# Patient Record
Sex: Male | Born: 1995
Health system: Southern US, Community
[De-identification: ages and names within clinical notes are randomized; demographics above are authoritative.]

---
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
## 2017-08-11 ENCOUNTER — Emergency Department (HOSPITAL_BASED_OUTPATIENT_CLINIC_OR_DEPARTMENT_OTHER): Payer: Self-pay

## 2017-08-11 ENCOUNTER — Other Ambulatory Visit: Payer: Self-pay

## 2017-08-11 ENCOUNTER — Encounter (HOSPITAL_BASED_OUTPATIENT_CLINIC_OR_DEPARTMENT_OTHER): Payer: Self-pay | Admitting: Emergency Medicine

## 2017-08-11 ENCOUNTER — Emergency Department (HOSPITAL_BASED_OUTPATIENT_CLINIC_OR_DEPARTMENT_OTHER)
Admission: EM | Admit: 2017-08-11 | Discharge: 2017-08-11 | Disposition: A | Discharge status: Home / Self Care | Payer: Self-pay | Attending: Emergency Medicine | Admitting: Emergency Medicine

## 2017-08-11 DIAGNOSIS — F17290 Nicotine dependence, other tobacco product, uncomplicated: Secondary | ICD-10-CM | POA: Insufficient documentation

## 2017-08-11 DIAGNOSIS — R638 Other symptoms and signs concerning food and fluid intake: Secondary | ICD-10-CM | POA: Insufficient documentation

## 2017-08-11 DIAGNOSIS — R110 Nausea: Secondary | ICD-10-CM | POA: Insufficient documentation

## 2017-08-11 DIAGNOSIS — J111 Influenza due to unidentified influenza virus with other respiratory manifestations: Secondary | ICD-10-CM | POA: Insufficient documentation

## 2017-08-11 DIAGNOSIS — R51 Headache: Secondary | ICD-10-CM | POA: Insufficient documentation

## 2017-08-11 DIAGNOSIS — M791 Myalgia, unspecified site: Secondary | ICD-10-CM | POA: Insufficient documentation

## 2017-08-11 DIAGNOSIS — R69 Illness, unspecified: Secondary | ICD-10-CM

## 2017-08-11 DIAGNOSIS — R05 Cough: Secondary | ICD-10-CM | POA: Insufficient documentation

## 2017-08-11 DIAGNOSIS — J181 Lobar pneumonia, unspecified organism: Secondary | ICD-10-CM | POA: Insufficient documentation

## 2017-08-11 DIAGNOSIS — J189 Pneumonia, unspecified organism: Secondary | ICD-10-CM

## 2017-08-11 DIAGNOSIS — J3489 Other specified disorders of nose and nasal sinuses: Secondary | ICD-10-CM | POA: Insufficient documentation

## 2017-08-11 DIAGNOSIS — R070 Pain in throat: Secondary | ICD-10-CM | POA: Insufficient documentation

## 2017-08-11 DIAGNOSIS — F17210 Nicotine dependence, cigarettes, uncomplicated: Secondary | ICD-10-CM | POA: Insufficient documentation

## 2017-08-11 LAB — BASIC METABOLIC PANEL
Anion gap: 11 (ref 5–15)
BUN: 14 mg/dL (ref 6–20)
CALCIUM: 9 mg/dL (ref 8.9–10.3)
CO2: 23 mmol/L (ref 22–32)
Chloride: 101 mmol/L (ref 101–111)
Creatinine, Ser: 0.91 mg/dL (ref 0.61–1.24)
GLUCOSE: 115 mg/dL — AB (ref 65–99)
Potassium: 3.7 mmol/L (ref 3.5–5.1)
Sodium: 135 mmol/L (ref 135–145)

## 2017-08-11 LAB — INFLUENZA PANEL BY PCR (TYPE A & B)
INFLBPCR: NEGATIVE
Influenza A By PCR: POSITIVE — AB

## 2017-08-11 LAB — URINALYSIS, ROUTINE W REFLEX MICROSCOPIC
Bilirubin Urine: NEGATIVE
GLUCOSE, UA: NEGATIVE mg/dL
HGB URINE DIPSTICK: NEGATIVE
Ketones, ur: NEGATIVE mg/dL
Leukocytes, UA: NEGATIVE
Nitrite: NEGATIVE
PH: 6.5 (ref 5.0–8.0)
PROTEIN: NEGATIVE mg/dL
Specific Gravity, Urine: 1.025 (ref 1.005–1.030)

## 2017-08-11 LAB — RAPID STREP SCREEN (MED CTR MEBANE ONLY): Streptococcus, Group A Screen (Direct): NEGATIVE

## 2017-08-11 MED ORDER — BENZONATATE 100 MG PO CAPS: 100.0000 mg | ORAL_CAPSULE | Freq: Three times a day (TID) | ORAL | 0 refills | Status: AC

## 2017-08-11 MED ORDER — SODIUM CHLORIDE 0.9 % IV BOLUS (SEPSIS)
1000.0000 mL | Freq: Once | INTRAVENOUS | Status: AC
  Administered 2017-08-11: 1000 mL via INTRAVENOUS

## 2017-08-11 MED ORDER — SODIUM CHLORIDE 0.9 % IV BOLUS (SEPSIS)
1000.0000 mL | Freq: Once | INTRAVENOUS | Status: AC
Start: 2017-08-11 — End: 2017-08-11
  Administered 2017-08-11: 1000 mL via INTRAVENOUS

## 2017-08-11 MED ORDER — OSELTAMIVIR PHOSPHATE 75 MG PO CAPS: 75.0000 mg | ORAL_CAPSULE | Freq: Two times a day (BID) | ORAL | 0 refills | Status: AC

## 2017-08-11 MED ORDER — ALBUTEROL SULFATE HFA 108 (90 BASE) MCG/ACT IN AERS: 1.0000 | INHALATION_SPRAY | Freq: Four times a day (QID) | RESPIRATORY_TRACT | 0 refills | Status: AC | PRN

## 2017-08-11 MED ORDER — BENZONATATE 100 MG PO CAPS
200.0000 mg | ORAL_CAPSULE | Freq: Once | ORAL | Status: AC
  Administered 2017-08-11: 200 mg via ORAL
  Filled 2017-08-11: qty 2

## 2017-08-11 MED ORDER — IBUPROFEN 400 MG PO TABS
600.0000 mg | ORAL_TABLET | Freq: Once | ORAL | Status: AC
  Administered 2017-08-11: 600 mg via ORAL
  Filled 2017-08-11: qty 1

## 2017-08-11 MED ORDER — DOXYCYCLINE HYCLATE 100 MG PO CAPS: 100.0000 mg | ORAL_CAPSULE | Freq: Two times a day (BID) | ORAL | 0 refills | Status: AC

## 2017-08-11 MED ORDER — IPRATROPIUM-ALBUTEROL 0.5-2.5 (3) MG/3ML IN SOLN
3.0000 mL | Freq: Once | RESPIRATORY_TRACT | Status: AC
Start: 2017-08-11 — End: 2017-08-11
  Administered 2017-08-11: 3 mL via RESPIRATORY_TRACT
  Filled 2017-08-11: qty 3

## 2017-08-11 MED ORDER — ACETAMINOPHEN 325 MG PO TABS
650.0000 mg | ORAL_TABLET | Freq: Once | ORAL | Status: AC
  Administered 2017-08-11: 650 mg via ORAL
  Filled 2017-08-11: qty 2

## 2017-08-11 MED FILL — DOXYCYCLINE HYC 100 MG CAP: 100 | 7 days supply | Qty: 14 | Fill #0

## 2017-08-11 MED FILL — BENZONATATE 100 MG CAPSULE: 100 | 5 days supply | Qty: 15 | Fill #0

## 2017-08-11 NOTE — ED Triage Notes (Signed)
Pt c/o SHOB since Sunday; c/o fatigue, np cough

## 2017-08-11 NOTE — ED Notes (Signed)
ED Provider at bedside. 

## 2017-08-11 NOTE — Discharge Instructions (Signed)
You have been diagnosed with the pneumonia in the ED.  Take antibiotics which is doxycycline as prescribed.  Also treated you for the flu with Tamiflu.  Make sure that you are alternating Motrin and Tylenol to keep the fever down.  Drink plenty of fluids to stay hydrated.  Have given you Tessalon for cough.  Use the albuterol inhaler for any wheezing that she may have.  Follow-up with a primary care doctor return to the ED with any worsening symptoms.

## 2017-08-11 NOTE — ED Provider Notes (Signed)
[facility] EMERGENCY DEPARTMENT Provider Note   CSN: [id number] Arrival date & time: 08/11/17  1037     History   Chief Complaint Chief Complaint  Patient presents with  . Shortness of Breath  . Cough    HPI Anthony Reeves is a 22 y.o. male.  HPI 22 year old Caucasian male with no pertinent past medical history presents to the emergency department today for evaluation of flulike symptoms.  Specifically patient states that his symptoms started 2 days ago.  Reports cough with sputum production, sore throat, rhinorrhea, body aches, nausea, diarrhea, decreased p.o. intake, headache, burning eyes.  Patient has no known sick contacts.  Denies getting a flu shot this year.  He has been taking over-the-counter cold and flu medication with only little relief.  Nothing makes his symptoms better or worse.  Patient reports some shortness of breath with the cough and chest discomfort with a productive cough.  Patient denies any history of IV drug use.  Denies any history of DVT/PE, prolonged immobilization or recent hospitalization/surgeries, unilateral leg swelling or calf tenderness, hormone use, tobacco use.  Has any associated abdominal pain, bloody stools, dysuria, hematuria, urgency, frequency. History reviewed. No pertinent past medical history.  There are no active problems to display for this patient.   History reviewed. No pertinent surgical history.     Home Medications    Prior to Admission medications   Medication Sig Start Date End Date Taking? Authorizing Provider  ibuprofen (ADVIL,MOTRIN) 200 MG tablet Take 200 mg by mouth as needed. For headache pain     [provider]    Family History No family history on file.  Social History Social History   Tobacco Use  . Smoking status: Current Every Day Smoker    Types: Cigarettes, E-cigarettes  . Smokeless tobacco: Never Used  Substance Use Topics  . Alcohol use: No    Frequency: Never  . Drug use: No       Allergies   Patient has no known allergies.   Review of Systems Review of Systems  All other systems reviewed and are negative.    Physical Exam Updated Vital Signs BP 121/63 (BP Location: Left Arm)   Pulse 95   Temp (!) 102.8 F (39.3 C) (Oral)   Resp 16   Ht 6\' 1"  (1.854 m)   Wt 68 kg (150 lb)   SpO2 95%   BMI 19.79 kg/m   Physical Exam  Constitutional: He is oriented to person, place, and time. He appears well-developed and well-nourished.  Non-toxic appearance. No distress.  HENT:  Head: Normocephalic and atraumatic.  Right Ear: Tympanic membrane, external ear and ear canal normal.  Left Ear: Tympanic membrane, external ear and ear canal normal.  Nose: Mucosal edema and rhinorrhea present.  Mouth/Throat: Uvula is midline, oropharynx is clear and moist and mucous membranes are normal. No trismus in the jaw. No uvula swelling. No tonsillar exudate.  Eyes: Conjunctivae are normal. Pupils are equal, round, and reactive to light. Right eye exhibits no discharge. Left eye exhibits no discharge.  Neck: Normal range of motion. Neck supple.  No c spine midline tenderness. No paraspinal tenderness. No deformities or step offs noted. Full ROM. Supple. No nuchal rigidity.    Cardiovascular: Regular rhythm, normal heart sounds and intact distal pulses. Exam reveals no gallop and no friction rub.  No murmur heard. Tachycardia noted   Pulmonary/Chest: Effort normal. No accessory muscle usage or stridor. No tachypnea. No respiratory distress. He has decreased breath  sounds. He has wheezes (end expiratory). He has rhonchi (throughout). He has no rales. He exhibits no tenderness.  Abdominal: Soft. Bowel sounds are normal. He exhibits no distension. There is no tenderness. There is no rigidity, no rebound, no guarding, no CVA tenderness, no tenderness at McBurney's point and negative Murphy's sign.  Musculoskeletal: Normal range of motion. He exhibits no tenderness.  No lower  extremity edema or calf tenderness.  Lymphadenopathy:    He has no cervical adenopathy.  Neurological: He is alert and oriented to person, place, and time.  Skin: Skin is warm and dry. Capillary refill takes less than 2 seconds. No rash noted.  Psychiatric: His behavior is normal. Judgment and thought content normal.  Nursing note and vitals reviewed.    ED Treatments / Results  Labs (all labs ordered are listed, but only abnormal results are displayed) Labs Reviewed  BASIC METABOLIC PANEL - Abnormal; Notable for the following components:      Result Value   Glucose, Bld 115 (*)    All other components within normal limits  INFLUENZA PANEL BY PCR (TYPE A & B) - Abnormal; Notable for the following components:   Influenza A By PCR POSITIVE (*)    All other components within normal limits  RAPID STREP SCREEN (NOT AT [facility])  CULTURE, GROUP A STREP ([facility])  URINALYSIS, ROUTINE W REFLEX MICROSCOPIC    EKG  EKG Interpretation None       Radiology No results found.  Procedures Procedures (including critical care time)  Medications Ordered in ED Medications  acetaminophen (TYLENOL) tablet 650 mg (650 mg Oral Given 08/11/17 1056)  sodium chloride 0.9 % bolus 1,000 mL (0 mLs Intravenous Stopped 08/11/17 1209)  ipratropium-albuterol (DUONEB) 0.5-2.5 (3) MG/3ML nebulizer solution 3 mL (3 mLs Nebulization Given 08/11/17 1139)  benzonatate (TESSALON) capsule 200 mg (200 mg Oral Given 08/11/17 1116)  sodium chloride 0.9 % bolus 1,000 mL (0 mLs Intravenous Stopped 08/11/17 1249)  ibuprofen (ADVIL,MOTRIN) tablet 600 mg (600 mg Oral Given 08/11/17 1303)     Initial Impression / Assessment and Plan / ED Course  I have reviewed the triage vital signs and the nursing notes.  Pertinent labs & imaging results that were available during my care of the patient were reviewed by me and considered in my medical decision making (see chart for details).     She presents to the ED with complaints of  flulike symptoms.  Symptoms started 2 days ago.  Patient is not septic or toxic appearing.  Does appear to not feel well.  Patient is febrile and tachycardic in triage.  No hypoxia noted.  No tachypnea or hypotension is noted.  Lungs with faint expiratory wheezes and rhonchorous sounds throughout.  Heart tachycardia noted with regular rhythm and no rubs murmurs or gallops.  No focal abdominal tenderness to palpation.  No signs of otitis media.  Oropharynx is clear.  Patient's lab work reassuring.  Electrolytes are reassuring.  Kidney function is normal.  UA shows no signs of infection.  Rapid strep test was negative.  He is influenza A positive.  Chest x-ray does show signs of possible left lower lobe infiltrate concerning for pneumonia this was reviewed by myself.  Patient heart rate improved with fluids and energy sick treatment.  Patient still remains febrile however is decreased from when he initially arrived in triage.  Patient given additional ibuprofen before he left.  Patient given breathing treatment and states that this helped his breathing.  Will start patient on  doxycycline and Tamiflu.  Instructed him to continue symptom medic treatment at home with Motrin and Tylenol Tessalon for cough.  Encourage plenty of p.o. Fluids.  Pt is hemodynamically stable, in NAD, & able to ambulate in the ED. Evaluation does not show pathology that would require ongoing emergent intervention or inpatient treatment. I explained the diagnosis to the patient. Pain has been managed & has no complaints prior to dc. Pt is comfortable with above plan and is stable for discharge at this time. All questions were answered prior to disposition. Strict return precautions for f/u to the ED were discussed. Encouraged follow up with PCP.  Dicussed with my attending who is agreeable the above plan.  Final Clinical Impressions(s) / ED Diagnoses   Final diagnoses:  Community acquired pneumonia of left lower lobe of lung  [facility](HCC)  Influenza-like illness    ED Discharge Orders    None       Wallace KellerLeaphart, Kenneth T, PA-C 08/11/17 1510    Nira Connardama, Pedro Eduardo, MD 08/11/17 [phone]

## 2017-08-13 LAB — CULTURE, GROUP A STREP (THRC)

## 2019-03-10 IMAGING — CR DG CHEST 2V
2 series · 2 of 2 positions shown · non-contrast
Comparison: March 20, 2011

CLINICAL DATA: Shortness of Breath

EXAM:
CHEST - 2 VIEW

[w chest pa]
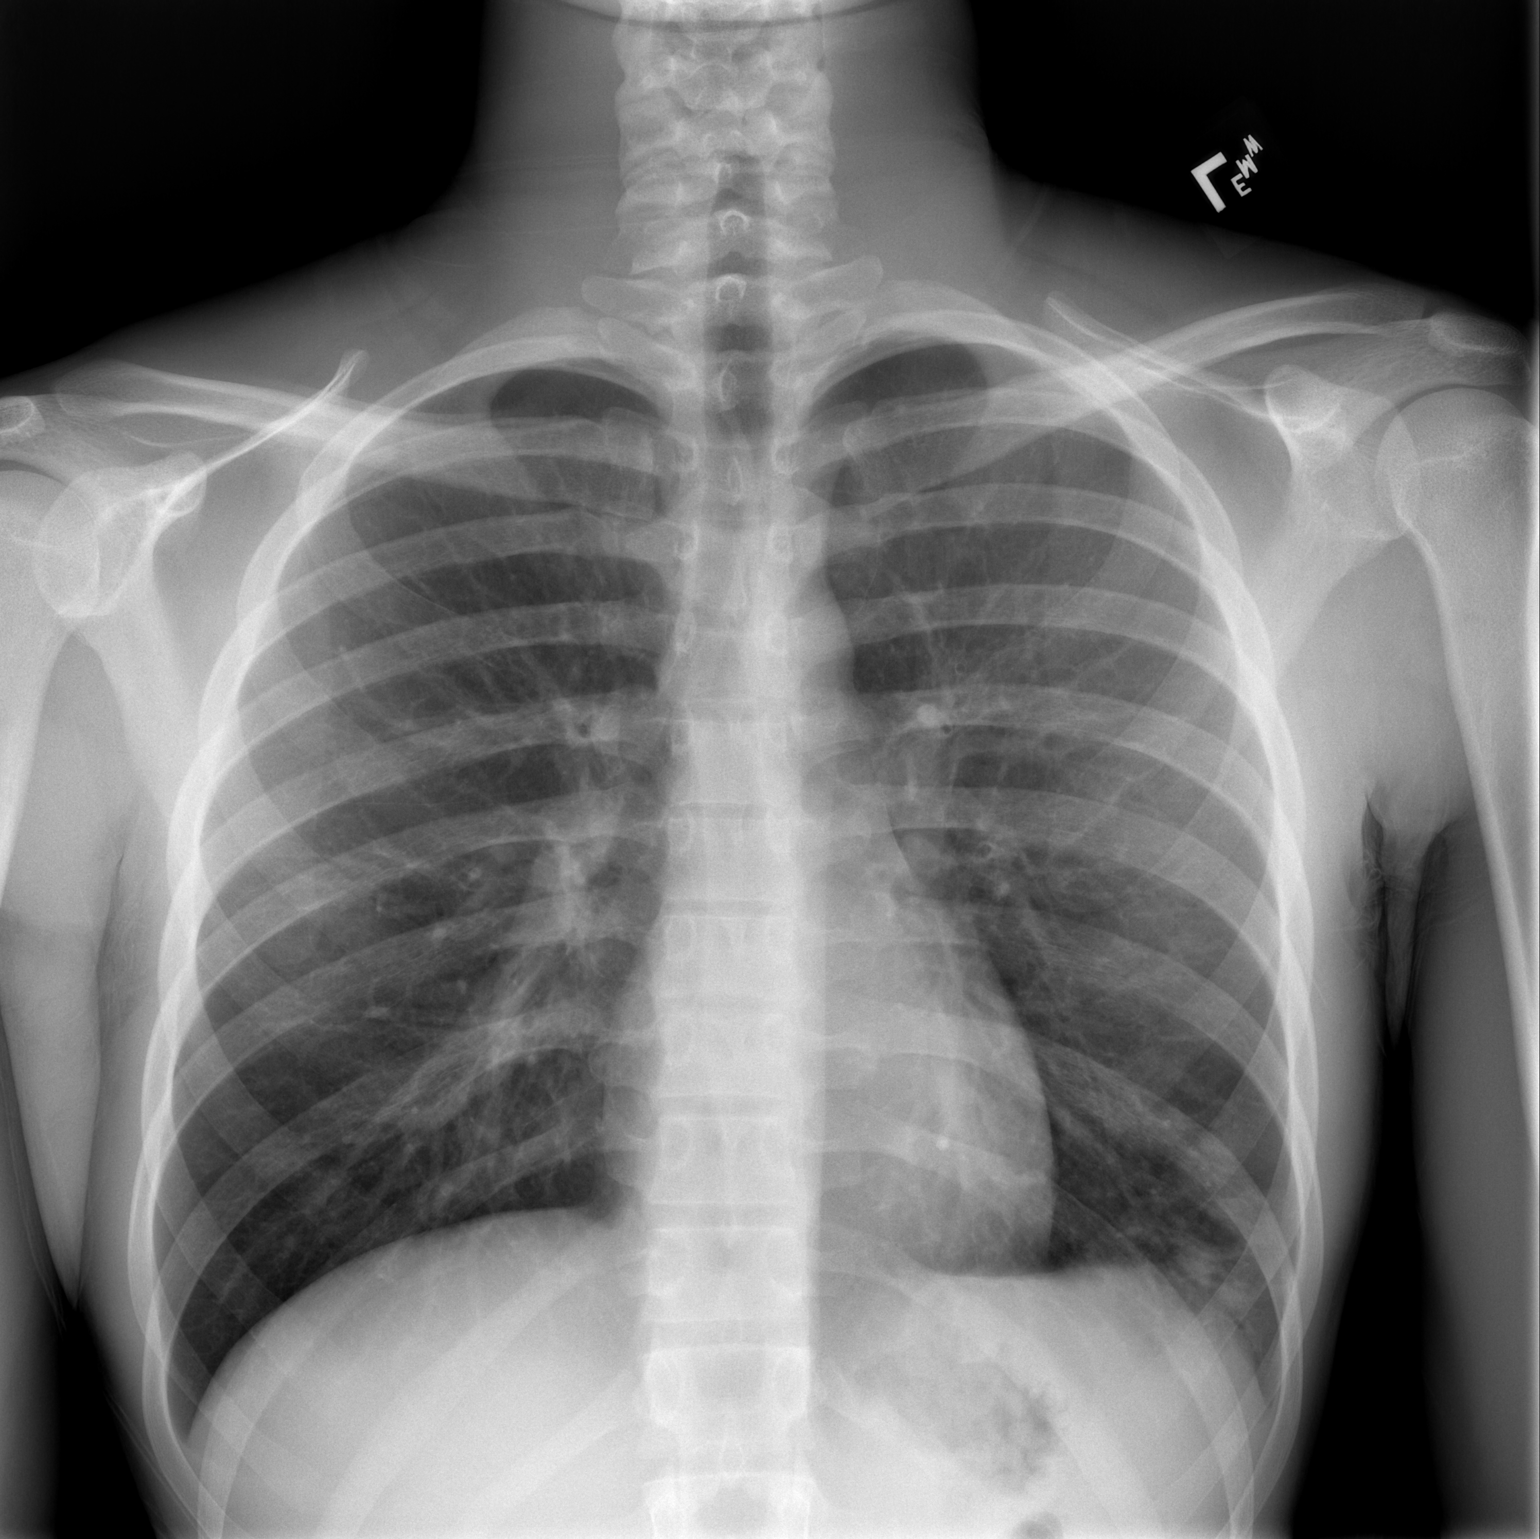

[w chest lat]
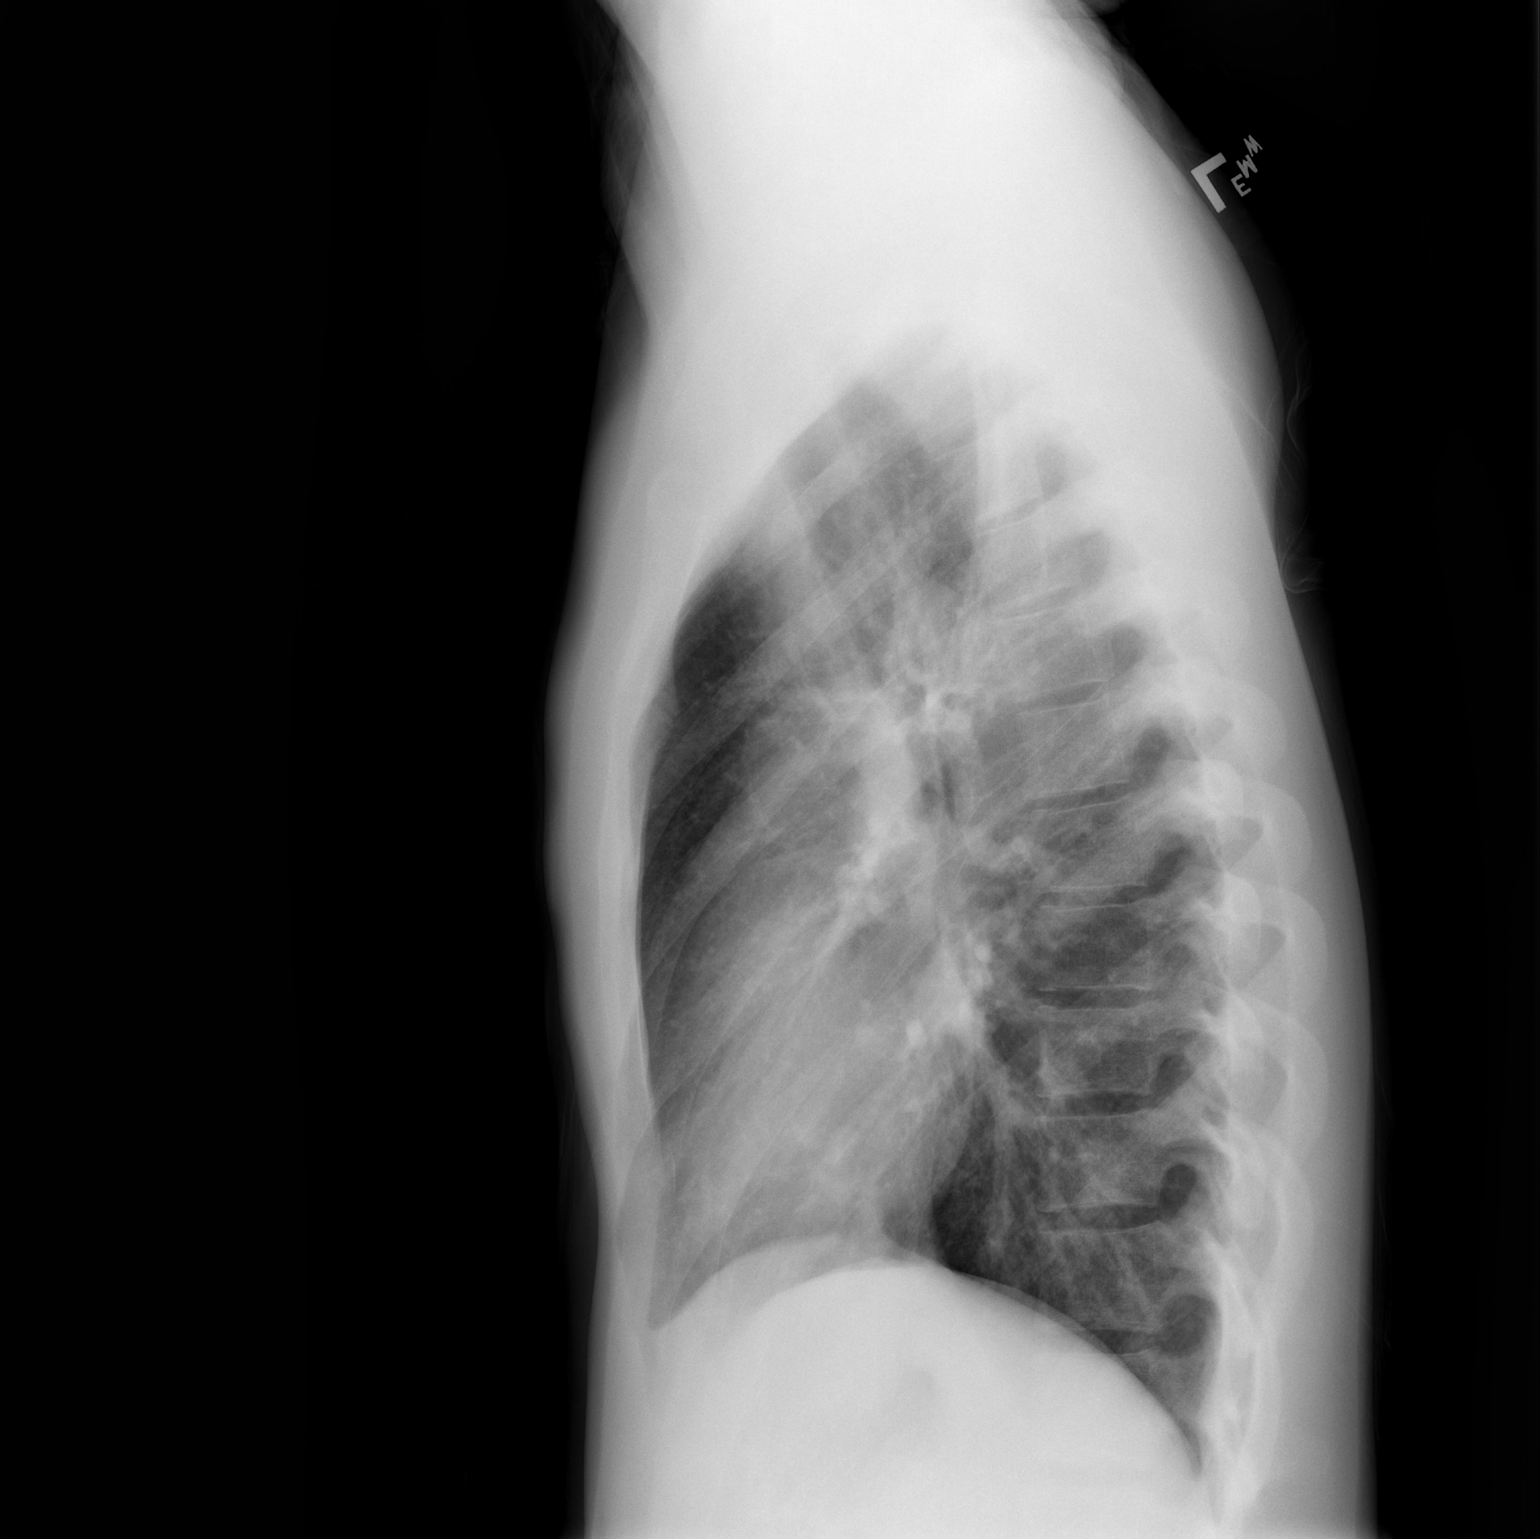

[2 of 2 positions shown; findings below may reference images not displayed]

FINDINGS: There is a small area of infiltrate in the lateral left base. Lungs
elsewhere are clear. Heart size and pulmonary vascularity are
normal. No adenopathy. No bone lesions.
IMPRESSION: Small area of infiltrate felt to represent pneumonia lateral left
base. Lungs elsewhere clear. No adenopathy.
